# Patient Record
Sex: Male | Born: 1990 | Race: White | Hispanic: No | Marital: Married | Smoking: Former smoker
Health system: Southern US, Community
[De-identification: ages and names within clinical notes are randomized; demographics above are authoritative.]

## PROBLEM LIST (undated history)

## (undated) DIAGNOSIS — F410 Panic disorder [episodic paroxysmal anxiety] without agoraphobia: Secondary | ICD-10-CM

---

## 2017-03-03 ENCOUNTER — Encounter (HOSPITAL_COMMUNITY): Payer: Self-pay | Admitting: Nurse Practitioner

## 2017-03-03 ENCOUNTER — Other Ambulatory Visit: Payer: Self-pay

## 2017-03-03 ENCOUNTER — Emergency Department (HOSPITAL_COMMUNITY)
Admission: EM | Admit: 2017-03-03 | Discharge: 2017-03-03 | Disposition: A | Discharge status: Home / Self Care | Payer: BLUE CROSS/BLUE SHIELD | Attending: Emergency Medicine | Admitting: Emergency Medicine

## 2017-03-03 DIAGNOSIS — Z5181 Encounter for therapeutic drug level monitoring: Secondary | ICD-10-CM | POA: Diagnosis present

## 2017-03-03 DIAGNOSIS — Z8659 Personal history of other mental and behavioral disorders: Secondary | ICD-10-CM | POA: Diagnosis not present

## 2017-03-03 DIAGNOSIS — Z79899 Other long term (current) drug therapy: Secondary | ICD-10-CM | POA: Diagnosis not present

## 2017-03-03 HISTORY — DX: Panic disorder (episodic paroxysmal anxiety): F41.0

## 2017-03-03 MED ORDER — LORAZEPAM 0.5 MG PO TABS: 0.5000 mg | ORAL_TABLET | Freq: Three times a day (TID) | ORAL | 0 refills | Status: DC | PRN

## 2017-03-03 MED ORDER — HYDROXYZINE HCL 25 MG PO TABS: 25.0000 mg | ORAL_TABLET | Freq: Four times a day (QID) | ORAL | 0 refills | Status: AC | PRN

## 2017-03-03 NOTE — ED Provider Notes (Signed)
[facility] EMERGENCY DEPARTMENT Provider Note   CSN: [id number] Arrival date & time: 03/03/17  2031     History   Chief Complaint Chief Complaint  Patient presents with  . Medication Management    HPI Edward Combs is a 26 y.o. male with history of panic attacks presents today requesting medication assistance.  He will be traveling to [place] tomorrow at 5 AM and will return to the [place] 2 weeks later.  This includes 2 flights tomorrow and 3 flights returning.  He states that he has a history of panic attacks during flights.  He states that during takeoff and landing, he will experience a sense of impending doom, tachycardia, and hyperventilation.  He states that he has never been on a flight this long before.  He states that he had tried 1 tablet of Valium given to him by his friend on a prior flight which she states seemed to help.  He denies any complaints at this time.  The history is provided by the patient.    Past Medical History:  Diagnosis Date  . Panic attacks     There are no active problems to display for this patient.     Home Medications    Prior to Admission medications   Medication Sig Start Date End Date Taking? Authorizing Provider  hydrOXYzine (ATARAX/VISTARIL) 25 MG tablet Take 1 tablet (25 mg total) by mouth every 6 (six) hours as needed for anxiety. 03/03/17   Jadelynn Boylan A, PA-C  LORazepam (ATIVAN) 0.5 MG tablet Take 1 tablet (0.5 mg total) by mouth every 8 (eight) hours as needed for anxiety. 03/03/17   Jeanie SewerFawze, Julian Medina A, PA-C    Family History No family history on file.  Social History Social History   Tobacco Use  . Smoking status: Not on file  Substance Use Topics  . Alcohol use: Not on file  . Drug use: Not on file     Allergies   Patient has no known allergies.   Review of Systems Review of Systems  Constitutional: Negative for fever.  Respiratory: Negative for shortness of breath.   Cardiovascular:  Negative for chest pain.  Psychiatric/Behavioral: The patient is nervous/anxious.      Physical Exam Updated Vital Signs BP (!) 121/93 (BP Location: Left Arm)   Pulse 74   Temp (!) 97.5 F (36.4 C) (Oral)   Resp 16   Ht 6' (1.829 m)   Wt 83.9 kg (185 lb)   SpO2 98%   BMI 25.09 kg/m   Physical Exam  Constitutional: He appears well-developed and well-nourished. No distress.  Resting comfortably in bed, no apparent distress  HENT:  Head: Normocephalic and atraumatic.  Eyes: Conjunctivae are normal. Right eye exhibits no discharge. Left eye exhibits no discharge.  Neck: No JVD present. No tracheal deviation present.  Cardiovascular: Normal rate.  Pulmonary/Chest: Effort normal.  Abdominal: He exhibits no distension.  Musculoskeletal: He exhibits no edema.  Neurological: He is alert.  Skin: Skin is warm and dry. No erythema.  Psychiatric: He has a normal mood and affect. His behavior is normal.  Does not appear to be responding to internal stimuli, very pleasant  Nursing note and vitals reviewed.    ED Treatments / Results  Labs (all labs ordered are listed, but only abnormal results are displayed) Labs Reviewed - No data to display  EKG  EKG Interpretation None       Radiology No results found.  Procedures Procedures (including critical care  time)  Medications Ordered in ED Medications - No data to display   Initial Impression / Assessment and Plan / ED Course  I have reviewed the triage vital signs and the nursing notes.  Pertinent labs & imaging results that were available during my care of the patient were reviewed by me and considered in my medical decision making (see chart for details).     Patient with history of panic attacks requesting medications to help him on a long [place] afebrile, vital signs are stable.  He is nontoxic in appearance.  He has no complaints at this time.to [place].  Will discharge with hydroxyzine to try.  Will  give backup small amount of low-dose Ativan to take if hydroxyzine does not help during the flights.  [place] controlled substance registry shows no controlled substances have been prescribed to him recently.  I do not suspect drug-seeking behavior at this time.  Discussed indications for return to the ED or for further medical evaluation. Pt and patient's wife verbalized understanding of and agreement with plan and is safe for discharge home at this time.  He has no complaints prior to discharge.  Final Clinical Impressions(s) / ED Diagnoses   Final diagnoses:  History of panic attacks  Medication management    ED Discharge Orders        Ordered    hydrOXYzine (ATARAX/VISTARIL) 25 MG tablet  Every 6 hours PRN     03/03/17 2201    LORazepam (ATIVAN) 0.5 MG tablet  Every 8 hours PRN     03/03/17 2201       Jeanie SewerFawze, Zaron Zwiefelhofer A, PA-C 03/03/17 2345    Jeanie SewerFawze, Serafino Burciaga A, PA-C 03/03/17 2345    Shaune PollackIsaacs, Cameron, MD 03/04/17 [phone]

## 2017-03-03 NOTE — Discharge Instructions (Signed)
Take hydroxyzine prior to your flight for anxiety.  If the hydroxyzine is not controlling your anxiety, you may take 1 tablet of Ativan as needed.  Seek medical attention or go to emergency department if any concerning signs or symptoms develop.

## 2017-03-03 NOTE — ED Triage Notes (Signed)
Pt sts gets panic attacks during long flights states has a trip planned going to [place] tomorrow and is concerned for panic attack during long flight and requesting medical assistance to help cope with flight.

## 2017-03-03 NOTE — ED Notes (Signed)
Signature pad not working, verbalized understanding of discharge instructions and prescriptions.   

## 2017-05-29 ENCOUNTER — Other Ambulatory Visit: Payer: Self-pay

## 2017-05-29 ENCOUNTER — Encounter (HOSPITAL_COMMUNITY): Payer: Self-pay

## 2017-05-29 ENCOUNTER — Emergency Department (HOSPITAL_COMMUNITY)
Admission: EM | Admit: 2017-05-29 | Discharge: 2017-05-29 | Disposition: A | Discharge status: Home / Self Care | Payer: BLUE CROSS/BLUE SHIELD | Attending: Emergency Medicine | Admitting: Emergency Medicine

## 2017-05-29 DIAGNOSIS — F410 Panic disorder [episodic paroxysmal anxiety] without agoraphobia: Secondary | ICD-10-CM | POA: Insufficient documentation

## 2017-05-29 MED ORDER — LORAZEPAM 1 MG PO TABS: 0.5000 mg | ORAL_TABLET | Freq: Three times a day (TID) | ORAL | 0 refills | Status: AC | PRN

## 2017-05-29 NOTE — ED Provider Notes (Signed)
[facility] EMERGENCY DEPARTMENT Provider Note   CSN: [id number] Arrival date & time: 05/29/17  1345     History   Chief Complaint No chief complaint on file.   HPI Edward Combs is a 27 y.o. male who presents the emergency department with chief complaint of panic attacks.  Patient states that he has a history of the same.  He is extremely stressed out.  He states that he is in the middle of exams, planning his wedding, and working extra hours to pay for school and his wedding.  He states that because of this he has been having frequent panic attacks.  He states that he has been here in the past and received medication because he was going on a trip to [place] and has panic attacks on airplanes.  He states that he just lost his insurance because he turned 27 does not currently have a primary care physician.  He denies suicidal ideation homicidal ideation or audiovisual hallucinations.  HPI  Past Medical History:  Diagnosis Date  . Panic attacks     There are no active problems to display for this patient.   History reviewed. No pertinent surgical history.      Home Medications    Prior to Admission medications   Medication Sig Start Date End Date Taking? Authorizing Provider  hydrOXYzine (ATARAX/VISTARIL) 25 MG tablet Take 1 tablet (25 mg total) by mouth every 6 (six) hours as needed for anxiety. 03/03/17   Fawze, Mina A, PA-C  LORazepam (ATIVAN) 0.5 MG tablet Take 1 tablet (0.5 mg total) by mouth every 8 (eight) hours as needed for anxiety. 03/03/17   Jeanie Sewer, PA-C    Family History No family history on file.  Social History Social History   Tobacco Use  . Smoking status: Not on file  Substance Use Topics  . Alcohol use: Not on file  . Drug use: Not on file     Allergies   Patient has no known allergies.   Review of Systems Review of Systems Positive for stress and anxiety, negative for any current chest pain, shortness of breath,  racing or skipping in the heart  Physical Exam Updated Vital Signs BP 124/87 (BP Location: Right Arm)   Pulse 93   Temp 98 F (36.7 C) (Oral)   Resp 16   SpO2 97%   Physical Exam  Physical Exam  Nursing note and vitals reviewed. Constitutional: He appears well-developed and well-nourished. No distress.  HENT:  Head: Normocephalic and atraumatic.  Eyes: Conjunctivae normal are normal. No scleral icterus.  Neck: Normal range of motion. Neck supple.  Cardiovascular: Normal rate, regular rhythm and normal heart sounds.   Pulmonary/Chest: Effort normal and breath sounds normal. No respiratory distress.  Abdominal: Soft. There is no tenderness.  Musculoskeletal: He exhibits no edema.  Neurological: He is alert.  Skin: Skin is warm and dry. He is not diaphoretic.  Psychiatric: His behavior is normal.    ED Treatments / Results  Labs (all labs ordered are listed, but only abnormal results are displayed) Labs Reviewed - No data to display  EKG None  Radiology No results found.  Procedures Procedures (including critical care time)  Medications Ordered in ED Medications - No data to display   Initial Impression / Assessment and Plan / ED Course  I have reviewed the triage vital signs and the nursing notes.  Pertinent labs & imaging results that were available during my care of the patient were reviewed  by me and considered in my medical decision making (see chart for details).     [facility].  His last prescription was in December for 4 tablets of 0.5 mg lorazepam.  I have given the patient a repeat prescription of 1 mg lorazepam and a referral to [facility].  Patient expresses understanding and agrees with plan of care.  Have no concern for other cause such as ACS, hyperthyroidism as history does not indicate this is the likely diagnosis.  Final Clinical Impressions(s) / ED Diagnoses   Final diagnoses:  Panic  attacks    ED Discharge Orders    None       Arthor CaptainHarris, Addeline Calarco, PA-C 05/29/17 1419    Mesner, Barbara CowerJason, MD 05/29/17 1709

## 2017-05-29 NOTE — Discharge Instructions (Addendum)
Contact a health care provider if: °Your symptoms do not improve, or they get worse. °You are not able to take your medicine as prescribed because of side effects. °Get help right away if: °You have serious thoughts about hurting yourself or others. °You have symptoms of a panic attack. Do not drive yourself to the hospital. Have someone else drive you or call an ambulance. °

## 2017-12-02 ENCOUNTER — Other Ambulatory Visit: Payer: Self-pay

## 2017-12-02 ENCOUNTER — Emergency Department (HOSPITAL_BASED_OUTPATIENT_CLINIC_OR_DEPARTMENT_OTHER): Payer: Self-pay

## 2017-12-02 ENCOUNTER — Encounter (HOSPITAL_BASED_OUTPATIENT_CLINIC_OR_DEPARTMENT_OTHER): Payer: Self-pay | Admitting: Emergency Medicine

## 2017-12-02 ENCOUNTER — Emergency Department (HOSPITAL_BASED_OUTPATIENT_CLINIC_OR_DEPARTMENT_OTHER)
Admission: EM | Admit: 2017-12-02 | Discharge: 2017-12-03 | Disposition: A | Discharge status: Home / Self Care | Payer: Self-pay | Attending: Emergency Medicine | Admitting: Emergency Medicine

## 2017-12-02 DIAGNOSIS — Z87891 Personal history of nicotine dependence: Secondary | ICD-10-CM | POA: Insufficient documentation

## 2017-12-02 DIAGNOSIS — R05 Cough: Secondary | ICD-10-CM | POA: Insufficient documentation

## 2017-12-02 DIAGNOSIS — R072 Precordial pain: Secondary | ICD-10-CM | POA: Insufficient documentation

## 2017-12-02 DIAGNOSIS — F1210 Cannabis abuse, uncomplicated: Secondary | ICD-10-CM | POA: Insufficient documentation

## 2017-12-02 LAB — I-STAT VENOUS BLOOD GAS, ED
Acid-base deficit: 5 mmol/L — ABNORMAL HIGH (ref 0.0–2.0)
Bicarbonate: 20.1 mmol/L (ref 20.0–28.0)
O2 SAT: 87 %
PCO2 VEN: 36.1 mmHg — AB (ref 44.0–60.0)
PH VEN: 7.352 (ref 7.250–7.430)
TCO2: 21 mmol/L — AB (ref 22–32)
pO2, Ven: 55 mmHg — ABNORMAL HIGH (ref 32.0–45.0)

## 2017-12-02 LAB — BASIC METABOLIC PANEL
Anion gap: 18 — ABNORMAL HIGH (ref 5–15)
BUN: 12 mg/dL (ref 6–20)
CHLORIDE: 105 mmol/L (ref 98–111)
CO2: 15 mmol/L — AB (ref 22–32)
CREATININE: 1.05 mg/dL (ref 0.61–1.24)
Calcium: 9.1 mg/dL (ref 8.9–10.3)
GFR calc Af Amer: >60 mL/min (ref >60)
GFR calc non Af Amer: >60 mL/min (ref >60)
Glucose, Bld: 81 mg/dL (ref 70–99)
Potassium: 3 mmol/L — ABNORMAL LOW (ref 3.5–5.1)
Sodium: 138 mmol/L (ref 135–145)

## 2017-12-02 LAB — ACETAMINOPHEN LEVEL: Acetaminophen (Tylenol), Serum: <10 ug/mL — ABNORMAL LOW (ref 10–30)

## 2017-12-02 LAB — CBC WITH DIFFERENTIAL/PLATELET
Basophils Absolute: 0 10*3/uL (ref 0.0–0.1)
Basophils Relative: 1 %
EOS ABS: 0.1 10*3/uL (ref 0.0–0.7)
Eosinophils Relative: 1 %
HEMATOCRIT: 48.6 % (ref 39.0–52.0)
HEMOGLOBIN: 17.1 g/dL — AB (ref 13.0–17.0)
LYMPHS ABS: 2.9 10*3/uL (ref 0.7–4.0)
Lymphocytes Relative: 34 %
MCH: 29.2 pg (ref 26.0–34.0)
MCHC: 35.2 g/dL (ref 30.0–36.0)
MCV: 83.1 fL (ref 78.0–100.0)
MONOS PCT: 12 %
Monocytes Absolute: 1 10*3/uL (ref 0.1–1.0)
NEUTROS ABS: 4.5 10*3/uL (ref 1.7–7.7)
NEUTROS PCT: 52 %
Platelets: 258 10*3/uL (ref 150–400)
RBC: 5.85 MIL/uL — ABNORMAL HIGH (ref 4.22–5.81)
RDW: 12.6 % (ref 11.5–15.5)
WBC: 8.5 10*3/uL (ref 4.0–10.5)

## 2017-12-02 LAB — SALICYLATE LEVEL: Salicylate Lvl: <7 mg/dL (ref 2.8–30.0)

## 2017-12-02 LAB — ETHANOL: Alcohol, Ethyl (B): <10 mg/dL (ref <10)

## 2017-12-02 LAB — I-STAT CG4 LACTIC ACID, ED: Lactic Acid, Venous: 0.46 mmol/L — ABNORMAL LOW (ref 0.5–1.9)

## 2017-12-02 MED ORDER — SODIUM CHLORIDE 0.9 % IV BOLUS (SEPSIS)
1000.0000 mL | Freq: Once | INTRAVENOUS | Status: AC
  Administered 2017-12-02: 1000 mL via INTRAVENOUS

## 2017-12-02 MED ORDER — SODIUM CHLORIDE 0.9 % IV SOLN
1000.0000 mL | INTRAVENOUS | Status: DC
Start: 2017-12-02 — End: 2017-12-03
  Administered 2017-12-03: 1000 mL via INTRAVENOUS

## 2017-12-02 MED ORDER — POTASSIUM CHLORIDE CRYS ER 20 MEQ PO TBCR
40.0000 meq | EXTENDED_RELEASE_TABLET | Freq: Once | ORAL | Status: AC
  Administered 2017-12-02: 40 meq via ORAL
  Filled 2017-12-02: qty 2

## 2017-12-02 MED ORDER — SODIUM CHLORIDE 0.9 % IV BOLUS (SEPSIS)
1000.0000 mL | Freq: Once | INTRAVENOUS | Status: AC
Start: 2017-12-02 — End: 2017-12-03
  Administered 2017-12-02: 1000 mL via INTRAVENOUS

## 2017-12-02 NOTE — ED Triage Notes (Signed)
Chest pain, sob onset yesterday after taking oral steriod. Pt tearful. Able to speak full sentences. EKG in triage. Skin warm and dry.

## 2017-12-02 NOTE — ED Notes (Signed)
Pt tearful and anxious upon RN entering room; sts he feels he cannot breathe; diaphoresis noted to forehead; visitor at bedside is calm and attentive; pt encouraged to take slow deep breaths; reassured resp status is WNL; pt on monitor with pulse ox reading 100% on RA; lights dimmed and pt was offered remote for TV, which he accepted.

## 2017-12-02 NOTE — ED Notes (Signed)
Warm blankets given.

## 2017-12-02 NOTE — ED Provider Notes (Signed)
[facility] EMERGENCY DEPARTMENT Provider Note   CSN: [id number] Arrival date & time: 12/02/17  1945     History   Chief Complaint Chief Complaint  Patient presents with  . Chest Pain    HPI Edward Combs is a 27 y.o. male.  The history is provided by the patient. No language interpreter was used.  Chest Pain   This is a new problem. The problem occurs constantly. The problem has been gradually worsening. The pain is associated with coughing. The pain is moderate. The pain does not radiate. Pertinent negatives include no abdominal pain. He has tried nothing for the symptoms. There are no known risk factors.  Pt reports he was treated on the 21st with prednisone, albuterol, tessalon and mucinex dm.  Pt reports he began having tightness in his chest tonight.    Past Medical History:  Diagnosis Date  . Panic attacks     There are no active problems to display for this patient.   History reviewed. No pertinent surgical history.      Home Medications    Prior to Admission medications   Medication Sig Start Date End Date Taking? Authorizing Provider  hydrOXYzine (ATARAX/VISTARIL) 25 MG tablet Take 1 tablet (25 mg total) by mouth every 6 (six) hours as needed for anxiety. 03/03/17   Fawze, Mina A, PA-C  LORazepam (ATIVAN) 1 MG tablet Take 0.5-1 tablets (0.5-1 mg total) by mouth every 8 (eight) hours as needed for anxiety. 05/29/17   Arthor Captain, PA-C    Family History No family history on file.  Social History Social History   Tobacco Use  . Smoking status: Former Games developer  . Smokeless tobacco: Never Used  Substance Use Topics  . Alcohol use: Yes    Comment: less than once a  month  . Drug use: Yes    Frequency: 1.0 times per week    Types: Marijuana    Comment: daily      Allergies   Patient has no known allergies.   Review of Systems Review of Systems  Cardiovascular: Positive for chest pain.  Gastrointestinal: Negative for abdominal  pain.  All other systems reviewed and are negative.    Physical Exam Updated Vital Signs BP 122/88   Pulse (!) 54   Temp 98.2 F (36.8 C) (Oral)   Resp 18   Ht 6\' 1"  (1.854 m)   Wt 78.9 kg   SpO2 96%   BMI 22.96 kg/m   Physical Exam  Constitutional: He appears well-developed and well-nourished.  HENT:  Head: Normocephalic and atraumatic.  Eyes: Conjunctivae are normal.  Neck: Normal range of motion. Neck supple.  Cardiovascular: Normal rate, regular rhythm and normal pulses.  No murmur heard. Pulmonary/Chest: Effort normal and breath sounds normal. No respiratory distress. He has no decreased breath sounds.  Abdominal: Soft. There is no tenderness.  Musculoskeletal: Normal range of motion. He exhibits no edema.  Neurological: He is alert.  Skin: Skin is warm and dry.  Psychiatric: He has a normal mood and affect.  Nursing note and vitals reviewed.    ED Treatments / Results  Labs (all labs ordered are listed, but only abnormal results are displayed) Labs Reviewed  CBC WITH DIFFERENTIAL/PLATELET - Abnormal; Notable for the following components:      Result Value   RBC 5.85 (*)    Hemoglobin 17.1 (*)    All other components within normal limits  BASIC METABOLIC PANEL - Abnormal; Notable for the following components:   Potassium  3.0 (*)    CO2 15 (*)    Anion gap 18 (*)    All other components within normal limits  URINALYSIS, ROUTINE W REFLEX MICROSCOPIC  RAPID URINE DRUG SCREEN, HOSP PERFORMED  SALICYLATE LEVEL  ACETAMINOPHEN LEVEL  ETHANOL  I-STAT CG4 LACTIC ACID, ED  I-STAT VENOUS BLOOD GAS, ED    EKG EKG Interpretation  Date/Time:  Wednesday December 02 2017 19:51:54 EDT Ventricular Rate:  84 PR Interval:  148 QRS Duration: 90 QT Interval:  372 QTC Calculation: 439 R Axis:   75 Text Interpretation:  Normal sinus rhythm with sinus arrhythmia Normal ECG No old tracing to compare Confirmed by Melene Plan [phone]) on 12/02/2017 11:02:36  PM   Radiology Dg Chest 2 View  Result Date: 12/02/2017 CLINICAL DATA:  Chest pain and shortness of breath. Took oral steroids yesterday. EXAM: CHEST - 2 VIEW COMPARISON:  None. FINDINGS: Cardiomediastinal silhouette is normal. No pleural effusions or focal consolidations. Trachea projects midline and there is no pneumothorax. Soft tissue planes and included osseous structures are non-suspicious. IMPRESSION: Normal chest. Electronically Signed   By: Awilda Metro M.D.   On: 12/02/2017 21:10    Procedures Procedures (including critical care time)  Medications Ordered in ED Medications  potassium chloride SA (K-DUR,KLOR-CON) CR tablet 40 mEq (has no administration in time range)  sodium chloride 0.9 % bolus 1,000 mL (1,000 mLs Intravenous New Bag/Given 12/02/17 2321)    Followed by  sodium chloride 0.9 % bolus 1,000 mL (has no administration in time range)    Followed by  0.9 %  sodium chloride infusion (has no administration in time range)     Initial Impression / Assessment and Plan / ED Course  I have reviewed the triage vital signs and the nursing notes.  Pertinent labs & imaging results that were available during my care of the patient were reviewed by me and considered in my medical decision making (see chart for details).     MDM  Labs reviewed  Pt has decreased potassium, CO2 15, Anion gap 18.    Pt given potassium 40 meq po.   IV ns x 2 liters  Acetaminophen and salicylates levels ordered.   Pt's care turned over to Dr. Nicanor Alcon at 12 midnight.  Labs pending.   Final Clinical Impressions(s) / ED Diagnoses   Final diagnoses:  None    ED Discharge Orders    None       Osie Cheeks 12/02/17 2338    Melene Plan, DO 12/03/17 1622

## 2017-12-03 LAB — COMPREHENSIVE METABOLIC PANEL
ALT: 11 U/L (ref 0–44)
ANION GAP: 9 (ref 5–15)
AST: 13 U/L — ABNORMAL LOW (ref 15–41)
Albumin: 3.6 g/dL (ref 3.5–5.0)
Alkaline Phosphatase: 40 U/L (ref 38–126)
BUN: 11 mg/dL (ref 6–20)
CALCIUM: 7.4 mg/dL — AB (ref 8.9–10.3)
CHLORIDE: 112 mmol/L — AB (ref 98–111)
CO2: 18 mmol/L — ABNORMAL LOW (ref 22–32)
Creatinine, Ser: 0.94 mg/dL (ref 0.61–1.24)
GFR calc non Af Amer: >60 mL/min (ref >60)
Glucose, Bld: 76 mg/dL (ref 70–99)
Potassium: 3.2 mmol/L — ABNORMAL LOW (ref 3.5–5.1)
SODIUM: 139 mmol/L (ref 135–145)
Total Bilirubin: 1.1 mg/dL (ref 0.3–1.2)
Total Protein: 5.4 g/dL — ABNORMAL LOW (ref 6.5–8.1)

## 2017-12-03 LAB — URINALYSIS, ROUTINE W REFLEX MICROSCOPIC
BILIRUBIN URINE: NEGATIVE
Glucose, UA: NEGATIVE mg/dL
Hgb urine dipstick: NEGATIVE
LEUKOCYTES UA: NEGATIVE
NITRITE: NEGATIVE
PH: 6 (ref 5.0–8.0)
PROTEIN: NEGATIVE mg/dL
Specific Gravity, Urine: 1.025 (ref 1.005–1.030)

## 2017-12-03 LAB — RAPID URINE DRUG SCREEN, HOSP PERFORMED
AMPHETAMINES: NOT DETECTED
BENZODIAZEPINES: NOT DETECTED
Barbiturates: NOT DETECTED
COCAINE: NOT DETECTED
OPIATES: NOT DETECTED
TETRAHYDROCANNABINOL: POSITIVE — AB

## 2017-12-03 LAB — D-DIMER, QUANTITATIVE (NOT AT ARMC)

## 2019-04-28 IMAGING — DX DG CHEST 2V
2 series · 2 of 2 positions shown · non-contrast
Comparison: None.

CLINICAL DATA: Chest pain and shortness of breath. Took oral
steroids yesterday.

EXAM:
CHEST - 2 VIEW

[chest pa]
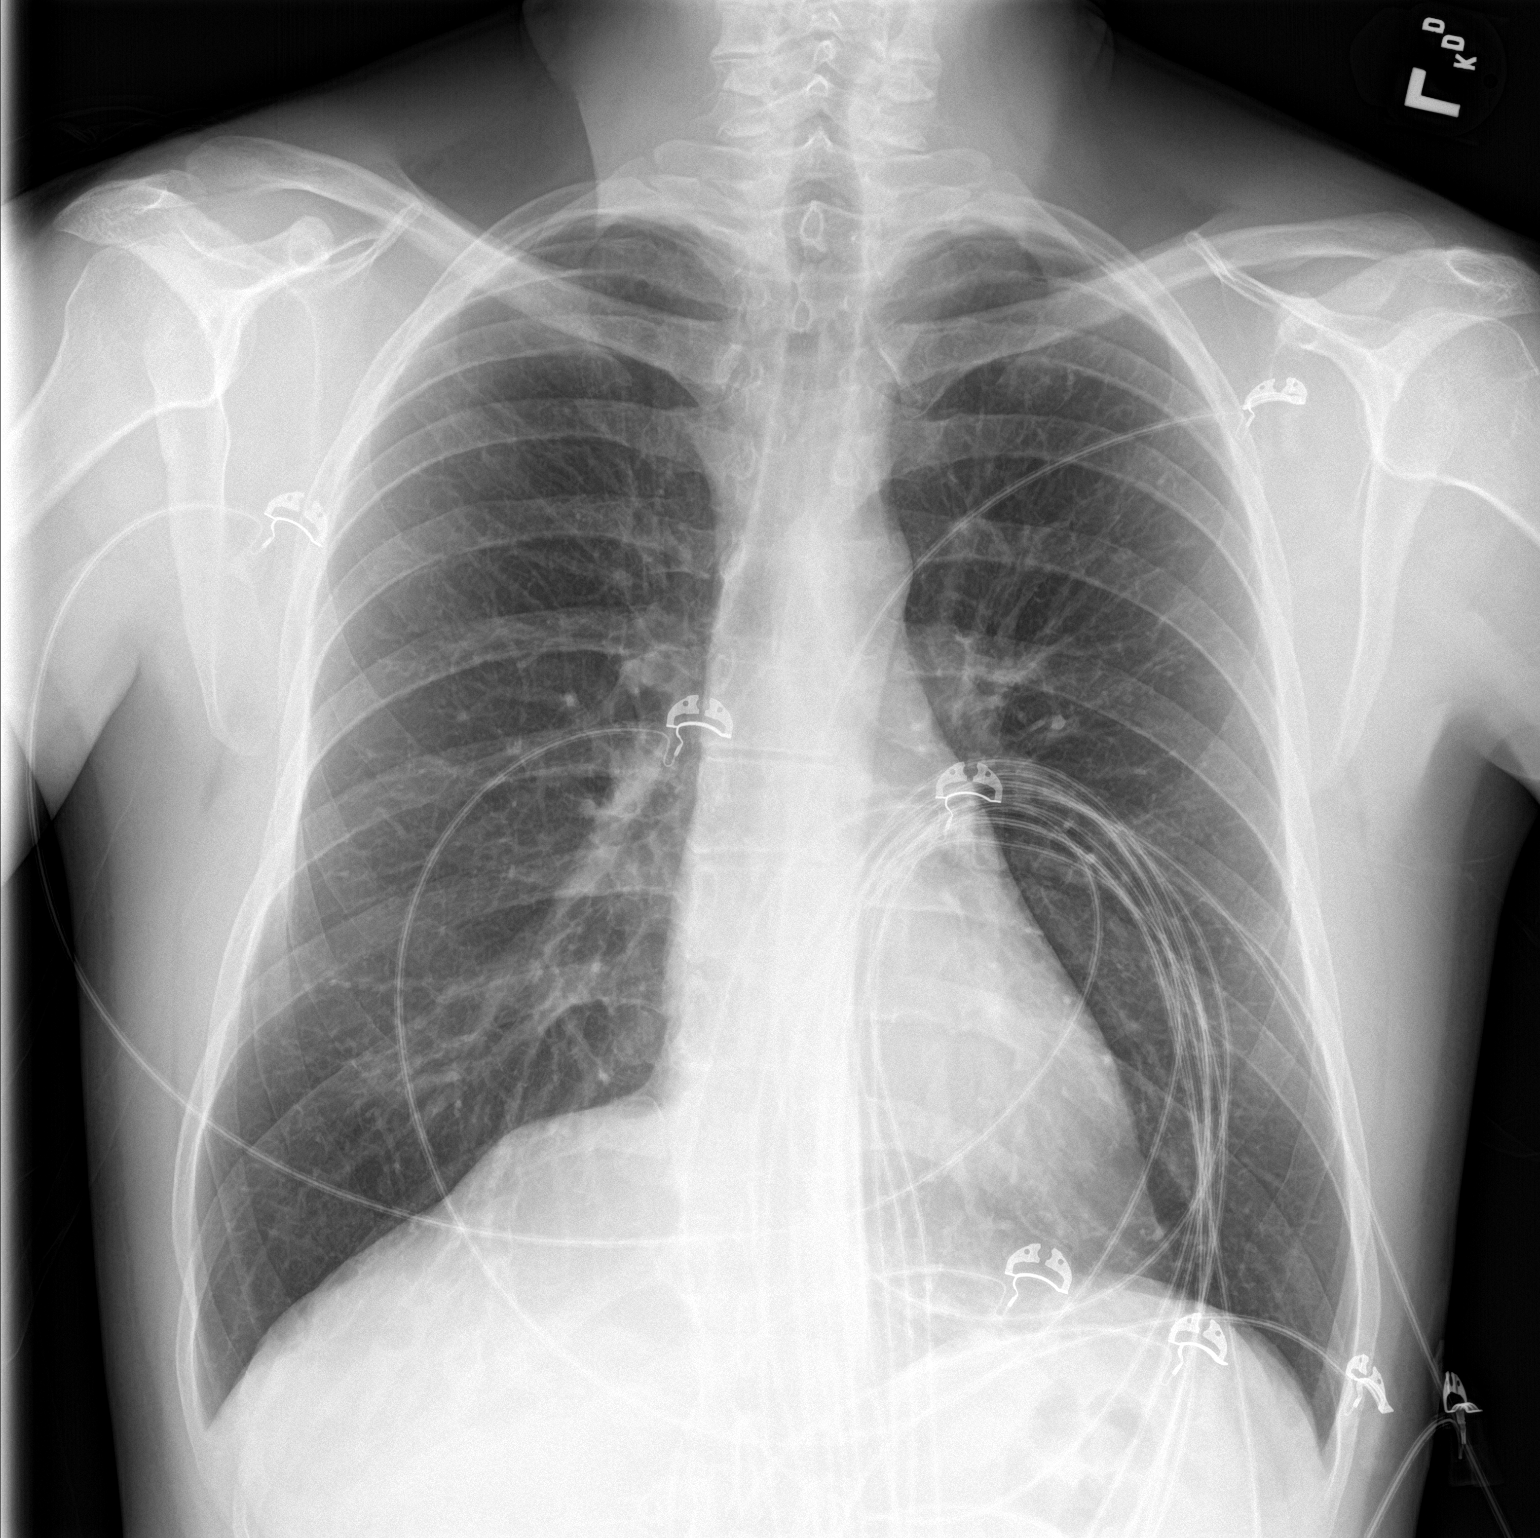

[chest lat]
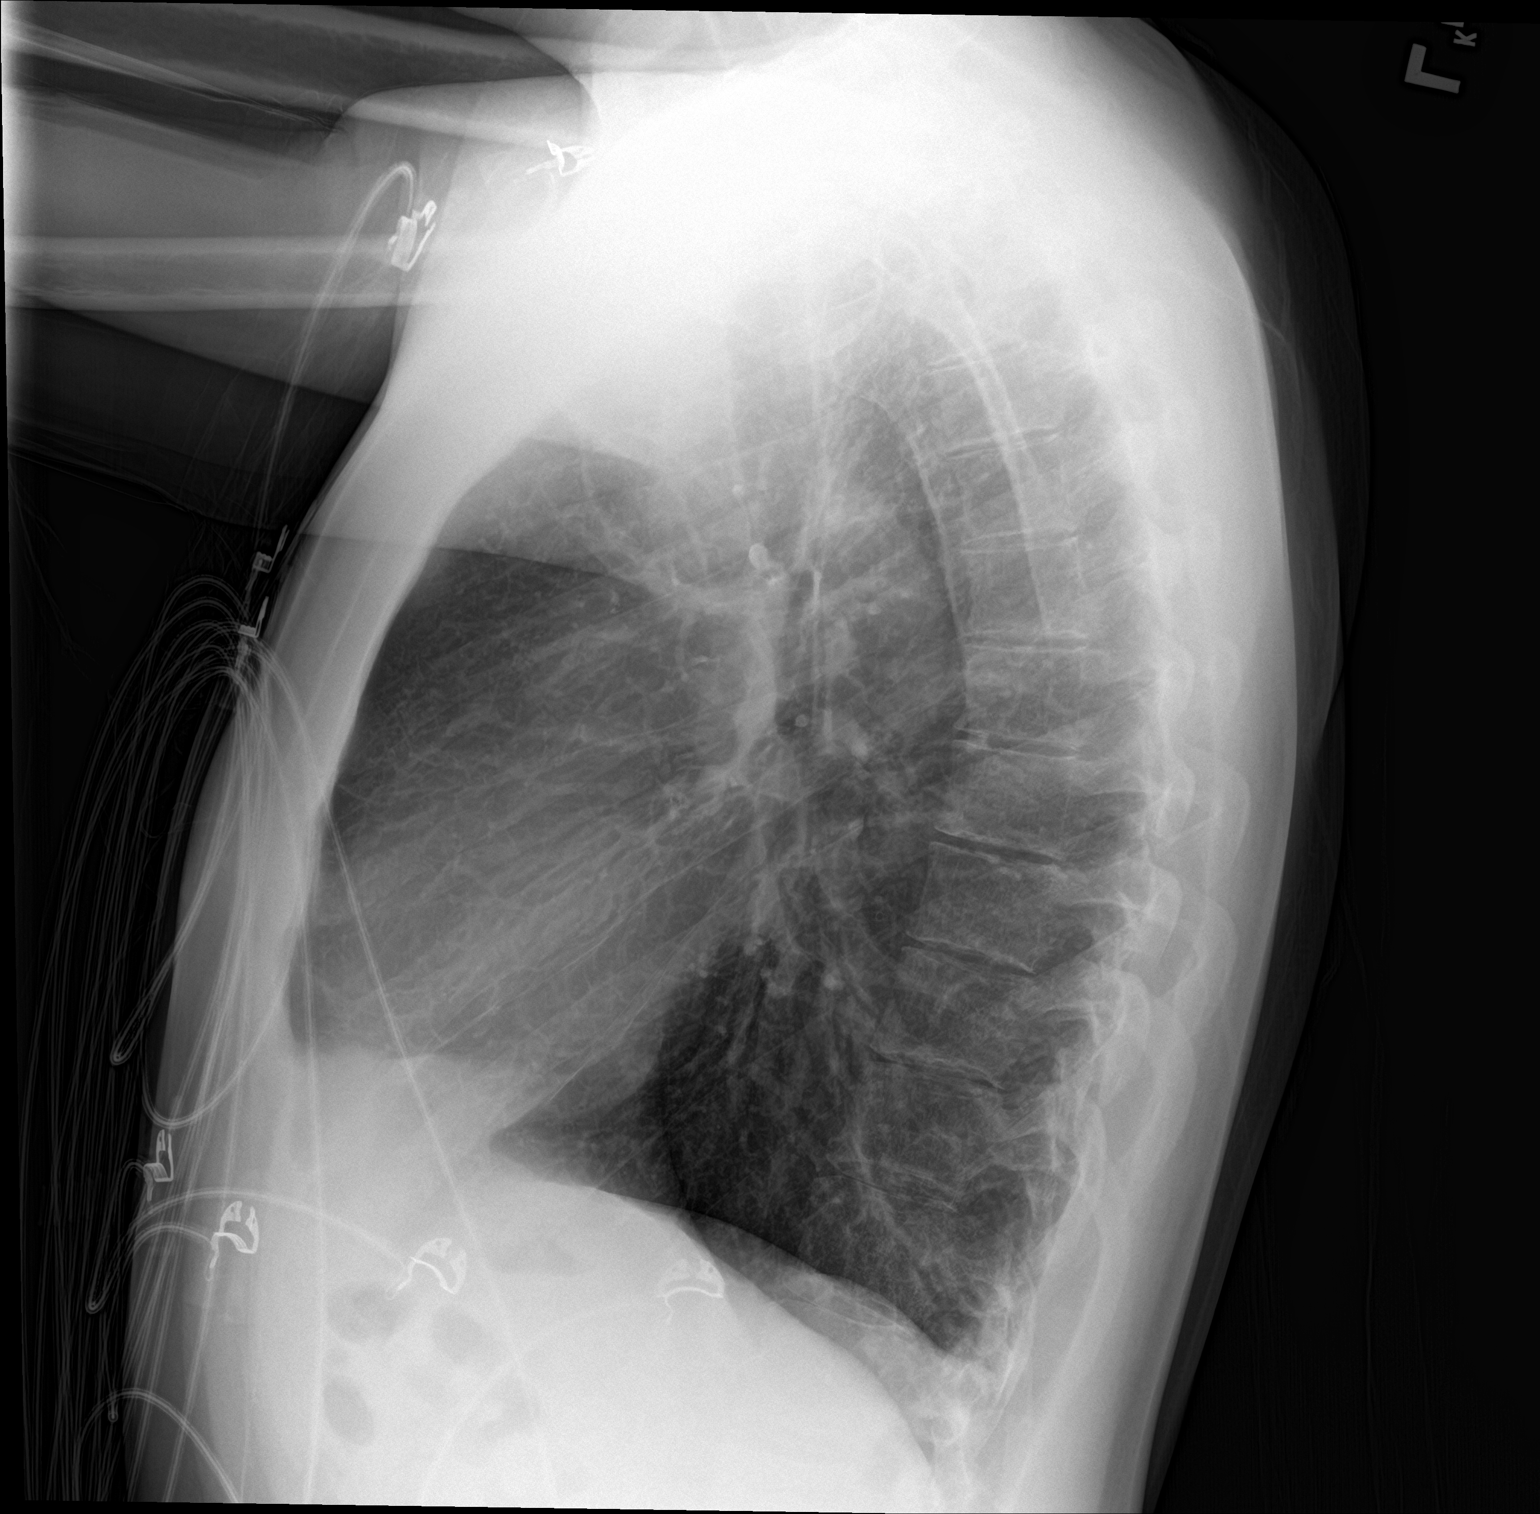

[2 of 2 positions shown; findings below may reference images not displayed]

FINDINGS: Cardiomediastinal silhouette is normal. No pleural effusions or
focal consolidations. Trachea projects midline and there is no
pneumothorax. Soft tissue planes and included osseous structures are
non-suspicious.
IMPRESSION: Normal chest.
